# Patient Record
Sex: Female | Born: 1995 | Race: Black or African American | Hispanic: No | Marital: Single | Smoking: Never smoker
Health system: Southern US, Community
[De-identification: ages and names within clinical notes are randomized; demographics above are authoritative.]

---
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
## 2017-10-10 ENCOUNTER — Telehealth: Payer: Self-pay | Admitting: Family Medicine

## 2017-10-10 NOTE — Telephone Encounter (Signed)
Mother stated Teresa Erickson was in college and would not like to make an appt.

## 2017-12-29 ENCOUNTER — Other Ambulatory Visit: Payer: Self-pay

## 2017-12-29 ENCOUNTER — Encounter: Payer: Self-pay | Admitting: Student in an Organized Health Care Education/Training Program

## 2017-12-29 ENCOUNTER — Ambulatory Visit (INDEPENDENT_AMBULATORY_CARE_PROVIDER_SITE_OTHER): Payer: Self-pay | Admitting: Student in an Organized Health Care Education/Training Program

## 2017-12-29 VITALS — BP 96/68 | HR 88 | Temp 98.9°F | Ht 67.0 in | Wt 135.8 lb

## 2017-12-29 DIAGNOSIS — H6122 Impacted cerumen, left ear: Secondary | ICD-10-CM

## 2017-12-29 DIAGNOSIS — H9192 Unspecified hearing loss, left ear: Secondary | ICD-10-CM

## 2017-12-29 DIAGNOSIS — H6983 Other specified disorders of Eustachian tube, bilateral: Secondary | ICD-10-CM

## 2017-12-29 MED ORDER — FLUTICASONE PROPIONATE 50 MCG/ACT NA SUSP: 2.0000 | Freq: Every day | NASAL | 6 refills | Status: AC

## 2017-12-29 MED ORDER — OXYMETAZOLINE HCL 0.05 % NA SOLN: 1.0000 | Freq: Two times a day (BID) | NASAL | 0 refills | Status: AC

## 2017-12-29 NOTE — Patient Instructions (Signed)
It was a pleasure seeing you today in our clinic. Here is the treatment plan we have discussed and agreed upon together:  Chew gum Take afrin for two days and then switch to flonase  If your hearing still seems decreased after 2-3 days give our office a call back  Our clinic's number is [phone]. Please call with questions or concerns about what we discussed today.  Be well, Dr. Mosetta PuttFeng

## 2017-12-29 NOTE — Progress Notes (Signed)
   CC: Decreased hearing in the Left ear  HPI: Teresa Erickson is a 22 y.o. female presenting with decreased hearing  Bilateral Ear pain and decreased hearing on the left Patient was on an airplane on Sunday and developed bilateral ear pain. The pain resolved when the plane landed, but then on her second flight she developed the bilateral pain again. The pain persisted for a little while and then resolved after she got home. However, she noticed decreased hearing on the left that has not resolved. No rhinorrhea, congestion. No sore throat. No fevers.  Review of Symptoms:  See HPI for ROS.   CC, SH/smoking status, and VS noted.  Objective: BP 96/68   Pulse 88   Temp 98.9 F (37.2 C) (Oral)   Ht 5\' 7"  (1.702 m)   Wt 135 lb 12.8 oz (61.6 kg)   LMP 12/21/2017 (Exact Date)   SpO2 100%   BMI 21.27 kg/m  GEN: NAD, alert, cooperative, and pleasant. EYE: no conjunctival injection, pupils equally round and reactive to light ENMT: normal tympanic light reflex, no nasal polyps,no rhinorrhea, no pharyngeal erythema or exudates NECK: full ROM, no thyromegaly RESPIRATORY: clear to auscultation bilaterally with no wheezes, rhonchi or rales, good effort CV: RRR, no m/r/g, no peripheral edema GI: soft, non-tender, non-distended, no hepatosplenomegaly SKIN: warm and dry, no rashes or lesions NEURO: II-XII grossly intact, normal gait, peripheral sensation intact PSYCH: AAOx3, appropriate affect  Assessment and plan:  Cerumen impaction, left Ears cleaned by the CMA today with resolution of the impaction. Ears were subsequently clean and tympanic membrane clearly visualized.  Eustachian tube dysfunction - likely causing bilateral effusions. Likely exacerbated by recent flight.  - encourage gum chewing - afrin x2 days, then stop - start flonase after the afrin  Decreased hearing, left ear - improved after ears were cleaned out but patient did not feel that it resolved. Eustachian management as  above. If hearing loss persists, she should call back in 2-3 days and would consider ENT referral at that time.  Meds ordered this encounter  Medications  . fluticasone (FLONASE) 50 MCG/ACT nasal spray    Sig: Place 2 sprays into both nostrils daily.    Dispense:  16 g    Refill:  6  . oxymetazoline (AFRIN NASAL SPRAY) 0.05 % nasal spray    Sig: Place 1 spray into both nostrils 2 (two) times daily.    Dispense:  30 mL    Refill:  0     Howard PouchLauren Weston Kallman, MD,MS,  PGY3 12/29/2017 5:04 PM

## 2022-04-20 DIAGNOSIS — Z124 Encounter for screening for malignant neoplasm of cervix: Secondary | ICD-10-CM | POA: Diagnosis not present

## 2022-04-20 DIAGNOSIS — Z6820 Body mass index (BMI) 20.0-20.9, adult: Secondary | ICD-10-CM | POA: Diagnosis not present

## 2022-04-20 DIAGNOSIS — Z0000 Encounter for general adult medical examination without abnormal findings: Secondary | ICD-10-CM | POA: Diagnosis not present

## 2022-04-20 DIAGNOSIS — Z113 Encounter for screening for infections with a predominantly sexual mode of transmission: Secondary | ICD-10-CM | POA: Diagnosis not present
